# Patient Record
Sex: Male | Born: 1970 | Race: White | Hispanic: No | Marital: Married | Smoking: Never smoker
Health system: Southern US, Community
[De-identification: ages and names within clinical notes are randomized; demographics above are authoritative.]

## PROBLEM LIST (undated history)

## (undated) DIAGNOSIS — I10 Essential (primary) hypertension: Secondary | ICD-10-CM

## (undated) DIAGNOSIS — K5790 Diverticulosis of intestine, part unspecified, without perforation or abscess without bleeding: Secondary | ICD-10-CM

## (undated) HISTORY — DX: Diverticulosis of intestine, part unspecified, without perforation or abscess without bleeding: K57.90

## (undated) HISTORY — DX: Essential (primary) hypertension: I10

---

## 2016-03-14 HISTORY — PX: COLONOSCOPY: SHX174

## 2016-03-14 LAB — HM COLONOSCOPY

## 2019-01-13 ENCOUNTER — Encounter: Payer: Self-pay | Admitting: Family Medicine

## 2019-01-13 ENCOUNTER — Ambulatory Visit: Payer: Managed Care, Other (non HMO) | Admitting: Family Medicine

## 2019-01-13 VITALS — BP 143/97 | HR 88 | Temp 98.6°F | Resp 16 | Ht 72.75 in | Wt 197.0 lb

## 2019-01-13 DIAGNOSIS — I10 Essential (primary) hypertension: Secondary | ICD-10-CM

## 2019-01-13 NOTE — Progress Notes (Signed)
Office Note 01/13/2019  CC:  Chief Complaint  Patient presents with  . Establish Care    hx of HTN    HPI:  Geoffrey Thomas is a 48 y.o. male who is here to establish care Patient's most recent primary MD: [facility] ([facility]). Old records were not available for review prior to or during today's visit.  Most recent CPE with his prior PCP was April 2019.  He also gets full CPE with labs via [employer].   HTN: bp usually 125 over <80.  Historically well controlled. He eats a low Na diet, exercises regularly.  Has been on losartan 100mg  qd for many years. All health panel labs were normal at that time.  Past Medical History:  Diagnosis Date  . Diverticulosis    +hx of 'itis.  . Hypertension     Past Surgical History:  Procedure Laterality Date  . COLONOSCOPY  2016   +polyps; per pt 10 yr f/u.  (?[facility])--->pt not sure, need records.    Family History  Problem Relation Age of Onset  . Stroke Mother   . Hypertension Father   . Prostatitis Father   . Hypertension Paternal Grandfather     Social History   Socioeconomic History  . Marital status: Married    Spouse name: Not on file  . Number of children: 2  . Years of education: Not on file  . Highest education level: Not on file  Occupational History  . Not on file  Social Needs  . Financial resource strain: Not on file  . Food insecurity:    Worry: Not on file    Inability: Not on file  . Transportation needs:    Medical: Not on file    Non-medical: Not on file  Tobacco Use  . Smoking status: Never Smoker  . Smokeless tobacco: Never Used  Substance and Sexual Activity  . Alcohol use: Not Currently  . Drug use: Not Currently  . Sexual activity: Yes    Partners: Female  Lifestyle  . Physical activity:    Days per week: 5 days    Minutes per session: 50 min  . Stress: Not on file  Relationships  . Social connections:    Talks on phone: Not on file    Gets together: Not  on file    Attends religious service: Not on file    Active member of club or organization: Not on file    Attends meetings of clubs or organizations: Not on file    Relationship status: Not on file  . Intimate partner violence:    Fear of current or ex partner: Not on file    Emotionally abused: Not on file    Physically abused: Not on file    Forced sexual activity: Not on file  Other Topics Concern  . Not on file  Social History Narrative   Married, 2 children ([name] and Piqua).   Orig from [place].   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   No T/A/Ds.       Outpatient Encounter Medications as of 01/13/2019  Medication Sig  . losartan (COZAAR) 100 MG tablet TAKE 1 TABLET BY MOUTH DAILY   No facility-administered encounter medications on file as of 01/13/2019.     No Known Allergies  ROS Review of Systems  Constitutional: Negative for fatigue and fever.  HENT: Negative for congestion and sore throat.   Eyes: Negative for visual  disturbance.  Respiratory: Negative for cough and shortness of breath.   Cardiovascular: Negative for chest pain and leg swelling.  Gastrointestinal: Negative for abdominal pain and nausea.  Genitourinary: Negative for dysuria.  Musculoskeletal: Negative for back pain and joint swelling.  Skin: Negative for rash.  Neurological: Negative for weakness and headaches.  Hematological: Negative for adenopathy.    PE; Blood pressure (!) 143/97, pulse 88, temperature 98.6 F (37 C), temperature source Oral, resp. rate 16, height 6' 0.75" (1.848 m), weight 197 lb (89.4 kg), SpO2 100 %. Body mass index is 26.17 kg/m.  Gen: Alert, well appearing.  Patient is oriented to person, place, time, and situation. AFFECT: pleasant, lucid thought and speech. CV: RRR, no m/r/g.   LUNGS: CTA bilat, nonlabored resps, good aeration in all lung fields. ABD: soft, ND/NT EXT: no clubbing or cyanosis.  no edema.  Skin: no  rashes, no jaundice or pallor.  Pertinent labs:  none  ASSESSMENT AND PLAN:   New pt: will obtain GI records of his colonoscopy b/c he is unsure of exact findings and recommendations of recall.  1) Essential HTN: bp up here today.  Consistently normal when monitoring at home on losartan 100 mg long term.  Pt states it is common for it to be elevated initially in MD office but on subsequent checks it comes down.  He has been very busy the last 4d due to father being in hospital. No change in med today. Last labs 02/2018 (CBC, FLP, TSH, CMET) all normal.  He will be getting his physical with labs through the PD in 3 months or so.  An After Visit Summary was printed and given to the patient.  Return in about 6 months (around 07/14/2019) for annual CPE (fasting).  Signed:  Santiago Bumpers, MD           01/13/2019

## 2019-01-14 ENCOUNTER — Encounter: Payer: Self-pay | Admitting: Family Medicine

## 2019-01-18 ENCOUNTER — Encounter: Payer: Self-pay | Admitting: Family Medicine

## 2019-02-28 ENCOUNTER — Other Ambulatory Visit: Payer: Self-pay

## 2019-02-28 MED ORDER — LOSARTAN POTASSIUM 100 MG PO TABS: 100.0000 mg | ORAL_TABLET | Freq: Every day | ORAL | 1 refills | Status: DC

## 2019-07-27 ENCOUNTER — Encounter: Payer: Managed Care, Other (non HMO) | Admitting: Family Medicine

## 2019-08-29 ENCOUNTER — Encounter: Payer: Managed Care, Other (non HMO) | Admitting: Family Medicine

## 2019-09-22 ENCOUNTER — Ambulatory Visit: Payer: Managed Care, Other (non HMO) | Admitting: Family Medicine

## 2019-09-22 ENCOUNTER — Other Ambulatory Visit: Payer: Self-pay

## 2019-09-22 ENCOUNTER — Encounter: Payer: Self-pay | Admitting: Family Medicine

## 2019-09-22 VITALS — BP 165/85 | HR 97 | Temp 99.1°F | Resp 16 | Ht 72.75 in | Wt 197.5 lb

## 2019-09-22 DIAGNOSIS — Z23 Encounter for immunization: Secondary | ICD-10-CM | POA: Diagnosis not present

## 2019-09-22 DIAGNOSIS — I10 Essential (primary) hypertension: Secondary | ICD-10-CM | POA: Diagnosis not present

## 2019-09-22 DIAGNOSIS — Z0000 Encounter for general adult medical examination without abnormal findings: Secondary | ICD-10-CM | POA: Diagnosis not present

## 2019-09-22 MED ORDER — LOSARTAN POTASSIUM 100 MG PO TABS: 100.0000 mg | ORAL_TABLET | Freq: Every day | ORAL | 1 refills | Status: DC

## 2019-09-22 NOTE — Progress Notes (Signed)
Office Note 09/22/2019  CC:  Chief Complaint  Patient presents with  . Annual Exam    pt is not fasting    HPI:  Geoffrey Thomas is a 48 y.o. White male who is here for annual health maintenance exam. 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  This was not done this year due to covid pandemic. He has had vegetable soup today about 1.5 hours ago.    Has HTN with hx of white coat component. Home bp's typically high 130s over high 80s.    Not in good routine of exercise since March this year. Not eating very healthy last few months, either.   Past Medical History:  Diagnosis Date  . Diverticulosis    +hx of 'itis.  . Hypertension     Past Surgical History:  Procedure Laterality Date  . COLONOSCOPY  03/14/2016   Report in chart, repeat in 10 years ([facility] [place])    Family History  Problem Relation Age of Onset  . Stroke Mother   . Hypertension Father   . Prostatitis Father   . Hypertension Paternal Grandfather     Social History   Socioeconomic History  . Marital status: Married    Spouse name: Not on file  . Number of children: 2  . Years of education: Not on file  . Highest education level: Not on file  Occupational History  . Not on file  Social Needs  . Financial resource strain: Not on file  . Food insecurity    Worry: Not on file    Inability: Not on file  . Transportation needs    Medical: Not on file    Non-medical: Not on file  Tobacco Use  . Smoking status: Never Smoker  . Smokeless tobacco: Never Used  Substance and Sexual Activity  . Alcohol use: Not Currently  . Drug use: Not Currently  . Sexual activity: Yes    Partners: Female  Lifestyle  . Physical activity    Days per week: 5 days    Minutes per session: 50 min  . Stress: Not on file  Relationships  . Social Musician on phone: Not on file    Gets together: Not on file    Attends religious service: Not on file    Active member of club  or organization: Not on file    Attends meetings of clubs or organizations: Not on file    Relationship status: Not on file  . Intimate partner violence    Fear of current or ex partner: Not on file    Emotionally abused: Not on file    Physically abused: Not on file    Forced sexual activity: Not on file  Other Topics Concern  . Not on file  Social History Narrative   Married, 2 children (Geoffrey Thomas and Geoffrey Thomas).   Orig from [place].   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   No T/A/Ds.       Outpatient Medications Prior to Visit  Medication Sig Dispense Refill  . losartan (COZAAR) 100 MG tablet Take 1 tablet (100 mg total) by mouth daily. 90 tablet 1   No facility-administered medications prior to visit.     No Known Allergies  ROS Review of Systems  Constitutional: Negative for appetite change, chills, fatigue and fever.  HENT: Negative for congestion, dental problem, ear pain and sore throat.   Eyes: Negative for  discharge, redness and visual disturbance.  Respiratory: Negative for cough, chest tightness, shortness of breath and wheezing.   Cardiovascular: Negative for chest pain, palpitations and leg swelling.  Gastrointestinal: Negative for abdominal pain, blood in stool, diarrhea, nausea and vomiting.  Genitourinary: Negative for difficulty urinating, dysuria, flank pain, frequency, hematuria and urgency.  Musculoskeletal: Negative for arthralgias, back pain, joint swelling, myalgias and neck stiffness.  Skin: Negative for pallor and rash.  Neurological: Negative for dizziness, speech difficulty, weakness and headaches.  Hematological: Negative for adenopathy. Does not bruise/bleed easily.  Psychiatric/Behavioral: Negative for confusion and sleep disturbance. The patient is not nervous/anxious.    PE; BP initially 165/85 automated cuff today; repeat with manual cuff later in the visit was 140/80 Blood pressure (!) 165/85, pulse  97, temperature 99.1 F (37.3 C), temperature source Temporal, resp. rate 16, height 6' 0.75" (1.848 m), weight 197 lb 8 oz (89.6 kg), SpO2 97 %. Body mass index is 26.24 kg/m.  Gen: Alert, well appearing.  Patient is oriented to person, place, time, and situation. AFFECT: pleasant, lucid thought and speech. ENT: Ears: EACs clear, normal epithelium.  TMs with good light reflex and landmarks bilaterally.  Eyes: no injection, icteris, swelling, or exudate.  EOMI, PERRLA. Nose: no drainage or turbinate edema/swelling.  No injection or focal lesion.  Mouth: lips without lesion/swelling.  Oral mucosa pink and moist.  Dentition intact and without obvious caries or gingival swelling.  Oropharynx without erythema, exudate, or swelling.  Neck: supple/nontender.  No LAD, mass, or TM.  Carotid pulses 2+ bilaterally, without bruits. CV: RRR, no m/r/g.   LUNGS: CTA bilat, nonlabored resps, good aeration in all lung fields. ABD: soft, NT, ND, BS normal.  No hepatospenomegaly or mass.  No bruits. EXT: no clubbing, cyanosis, or edema.  Musculoskeletal: no joint swelling, erythema, warmth, or tenderness.  ROM of all joints intact. Skin - no sores or suspicious lesions or rashes or color changes   Pertinent labs:  None  ASSESSMENT AND PLAN:   1) HTN: not ideal control.  I recommended adding a second bp med today but he really doesn't like the idea of taking an additional med for HTN.  He said he will think about it and increase home bp monitoring.  Also wants to get back into better dietary and exercise habits. Instructions: Monitor your blood pressure at least 2 times per week. Normal blood pressure is 130/80 or lower. If greater than 140/90 AVERAGE then I strongly recommend you call or return so I can add an additional bp med.  2) Health maintenance exam: Reviewed age and gender appropriate health maintenance issues (prudent diet, regular exercise, health risks of tobacco and excessive alcohol, use of  seatbelts, fire alarms in home, use of sunscreen).  Also reviewed age and gender appropriate health screening as well as vaccine recommendations. Vaccines: flu vaccine->given today. Labs: health panel today: he prefered this be drawn today despite not being fasting. Prostate ca screening: average risk patient= as per latest guidelines, start screening at 62 yrs of age. Colon ca screening: next colonoscopy due 2027.  An After Visit Summary was printed and given to the patient.  FOLLOW UP:  Return in about 6 months (around 03/22/2020) for routine chronic illness f/u.  Signed:  Crissie Sickles, MD           09/22/2019

## 2019-09-22 NOTE — Addendum Note (Signed)
Addended by: Deveron Furlong D on: 09/22/2019 02:29 PM   Modules accepted: Orders

## 2019-09-22 NOTE — Patient Instructions (Signed)
Monitor your blood pressure at least 2 times per week. Normal blood pressure is 130/80 or lower. If greater than 140/90 AVERAGE then I strongly recommend you call or return so I can add an additional bp med.   Health Maintenance, Male Adopting a healthy lifestyle and getting preventive care are important in promoting health and wellness. Ask your health care provider about:  The right schedule for you to have regular tests and exams.  Things you can do on your own to prevent diseases and keep yourself healthy. What should I know about diet, weight, and exercise? Eat a healthy diet   Eat a diet that includes plenty of vegetables, fruits, low-fat dairy products, and lean protein.  Do not eat a lot of foods that are high in solid fats, added sugars, or sodium. Maintain a healthy weight Body mass index (BMI) is a measurement that can be used to identify possible weight problems. It estimates body fat based on height and weight. Your health care provider can help determine your BMI and help you achieve or maintain a healthy weight. Get regular exercise Get regular exercise. This is one of the most important things you can do for your health. Most adults should:  Exercise for at least 150 minutes each week. The exercise should increase your heart rate and make you sweat (moderate-intensity exercise).  Do strengthening exercises at least twice a week. This is in addition to the moderate-intensity exercise.  Spend less time sitting. Even light physical activity can be beneficial. Watch cholesterol and blood lipids Have your blood tested for lipids and cholesterol at 48 years of age, then have this test every 5 years. You may need to have your cholesterol levels checked more often if:  Your lipid or cholesterol levels are high.  You are older than 48 years of age.  You are at high risk for heart disease. What should I know about cancer screening? Many types of cancers can be detected  early and may often be prevented. Depending on your health history and family history, you may need to have cancer screening at various ages. This may include screening for:  Colorectal cancer.  Prostate cancer.  Skin cancer.  Lung cancer. What should I know about heart disease, diabetes, and high blood pressure? Blood pressure and heart disease  High blood pressure causes heart disease and increases the risk of stroke. This is more likely to develop in people who have high blood pressure readings, are of African descent, or are overweight.  Talk with your health care provider about your target blood pressure readings.  Have your blood pressure checked: ? Every 3-5 years if you are 27-51 years of age. ? Every year if you are 38 years old or older.  If you are between the ages of 62 and 5 and are a current or former smoker, ask your health care provider if you should have a one-time screening for abdominal aortic aneurysm (AAA). Diabetes Have regular diabetes screenings. This checks your fasting blood sugar level. Have the screening done:  Once every three years after age 55 if you are at a normal weight and have a low risk for diabetes.  More often and at a younger age if you are overweight or have a high risk for diabetes. What should I know about preventing infection? Hepatitis B If you have a higher risk for hepatitis B, you should be screened for this virus. Talk with your health care provider to find out if you are  at risk for hepatitis B infection. Hepatitis C Blood testing is recommended for:  Everyone born from [birth year] through 1965.  Anyone with known risk factors for hepatitis C. Sexually transmitted infections (STIs)  You should be screened each year for STIs, including gonorrhea and chlamydia, if: ? You are sexually active and are younger than 48 years of age. ? You are older than 48 years of age and your health care provider tells you that you are at risk for this  type of infection. ? Your sexual activity has changed since you were last screened, and you are at increased risk for chlamydia or gonorrhea. Ask your health care provider if you are at risk.  Ask your health care provider about whether you are at high risk for HIV. Your health care provider may recommend a prescription medicine to help prevent HIV infection. If you choose to take medicine to prevent HIV, you should first get tested for HIV. You should then be tested every 3 months for as long as you are taking the medicine. Follow these instructions at home: Lifestyle  Do not use any products that contain nicotine or tobacco, such as cigarettes, e-cigarettes, and chewing tobacco. If you need help quitting, ask your health care provider.  Do not use street drugs.  Do not share needles.  Ask your health care provider for help if you need support or information about quitting drugs. Alcohol use  Do not drink alcohol if your health care provider tells you not to drink.  If you drink alcohol: ? Limit how much you have to 0-2 drinks a day. ? Be aware of how much alcohol is in your drink. In the [place] one drink equals one 12 oz bottle of beer (355 mL), one 5 oz glass of wine (148 mL), or one 1 oz glass of hard liquor (44 mL). General instructions  Schedule regular health, dental, and eye exams.  Stay current with your vaccines.  Tell your health care provider if: ? You often feel depressed. ? You have ever been abused or do not feel safe at home. Summary  Adopting a healthy lifestyle and getting preventive care are important in promoting health and wellness.  Follow your health care provider's instructions about healthy diet, exercising, and getting tested or screened for diseases.  Follow your health care provider's instructions on monitoring your cholesterol and blood pressure. This information is not intended to replace advice given to you by your health care provider. Make sure  you discuss any questions you have with your health care provider. Document Released: 05/22/2008 Document Revised: 11/17/2018 Document Reviewed: 11/17/2018 Elsevier Patient Education  2020 [employer].

## 2019-09-23 ENCOUNTER — Ambulatory Visit: Payer: Managed Care, Other (non HMO)

## 2019-09-23 ENCOUNTER — Other Ambulatory Visit: Payer: Self-pay | Admitting: Family Medicine

## 2019-09-23 DIAGNOSIS — R7989 Other specified abnormal findings of blood chemistry: Secondary | ICD-10-CM

## 2019-09-23 LAB — TSH: TSH: 0.74 u[IU]/mL (ref 0.35–4.50)

## 2019-09-23 LAB — CBC WITH DIFFERENTIAL/PLATELET
Basophils Absolute: 0.1 10*3/uL (ref 0.0–0.1)
Basophils Relative: 0.9 % (ref 0.0–3.0)
Eosinophils Absolute: 0.1 10*3/uL (ref 0.0–0.7)
Eosinophils Relative: 1.1 % (ref 0.0–5.0)
HCT: 47.5 % (ref 39.0–52.0)
Hemoglobin: 15.8 g/dL (ref 13.0–17.0)
Lymphocytes Relative: 18.6 % (ref 12.0–46.0)
Lymphs Abs: 1.7 10*3/uL (ref 0.7–4.0)
MCHC: 33.3 g/dL (ref 30.0–36.0)
MCV: 91.4 fl (ref 78.0–100.0)
Monocytes Absolute: 0.9 10*3/uL (ref 0.1–1.0)
Monocytes Relative: 9.7 % (ref 3.0–12.0)
Neutro Abs: 6.2 10*3/uL (ref 1.4–7.7)
Neutrophils Relative %: 69.7 % (ref 43.0–77.0)
Platelets: 286 10*3/uL (ref 150.0–400.0)
RBC: 5.2 Mil/uL (ref 4.22–5.81)
RDW: 13.2 % (ref 11.5–15.5)
WBC: 8.9 10*3/uL (ref 4.0–10.5)

## 2019-09-23 LAB — LIPID PANEL
Cholesterol: 162 mg/dL (ref 0–200)
HDL: 40.4 mg/dL (ref >39.00)
LDL Cholesterol: 107 mg/dL — ABNORMAL HIGH (ref 0–99)
NonHDL: 121.1
Total CHOL/HDL Ratio: 4
Triglycerides: 72 mg/dL (ref 0.0–149.0)
VLDL: 14.4 mg/dL (ref 0.0–40.0)

## 2019-09-23 LAB — COMPREHENSIVE METABOLIC PANEL
ALT: 13 U/L (ref 0–53)
AST: 21 U/L (ref 0–37)
Albumin: 4.7 g/dL (ref 3.5–5.2)
Alkaline Phosphatase: 66 U/L (ref 39–117)
BUN: 25 mg/dL — ABNORMAL HIGH (ref 6–23)
CO2: 29 mEq/L (ref 19–32)
Calcium: 9.8 mg/dL (ref 8.4–10.5)
Chloride: 102 mEq/L (ref 96–112)
Creatinine, Ser: 1.62 mg/dL — ABNORMAL HIGH (ref 0.40–1.50)
GFR: 45.74 mL/min — ABNORMAL LOW (ref >60.00)
Glucose, Bld: 100 mg/dL — ABNORMAL HIGH (ref 70–99)
Potassium: 4.8 mEq/L (ref 3.5–5.1)
Sodium: 140 mEq/L (ref 135–145)
Total Bilirubin: 1.2 mg/dL (ref 0.2–1.2)
Total Protein: 6.5 g/dL (ref 6.0–8.3)

## 2019-09-26 ENCOUNTER — Telehealth: Payer: Self-pay | Admitting: Family Medicine

## 2019-09-26 NOTE — Telephone Encounter (Signed)
Ok to hold off on the ultrasound for now. Keep plan of repeating lab and if not improved then will need to do ultrasound. Stay off NSAIDs-thx

## 2019-09-26 NOTE — Telephone Encounter (Signed)
Patient advised and voiced understanding.  

## 2019-09-26 NOTE — Telephone Encounter (Signed)
Patient called in. Patient had been experiencing shoulder pain 3-5 days prior to appointment. Patient was taking OTC Ibuprofen and Aleve. Please call patient back. He wants to know if he still needs to have the [place].

## 2019-09-26 NOTE — Telephone Encounter (Signed)
Patient's last visit was 09/22/19 for CPE. No mention of shoulder pain. He has taken OTC Ibuprofen and aleve. US renal ordered at the time of appt 10/15.   Please advise if [place] should still be done?

## 2019-10-05 NOTE — Telephone Encounter (Signed)
Should 10/14/19 visit be an office visit or a lab visit only?

## 2019-10-05 NOTE — Telephone Encounter (Signed)
The 11/6 appointment will be an office visit with [place].

## 2019-10-14 ENCOUNTER — Ambulatory Visit: Payer: Managed Care, Other (non HMO) | Admitting: Family Medicine

## 2020-01-17 ENCOUNTER — Telehealth: Payer: Self-pay

## 2020-01-17 ENCOUNTER — Other Ambulatory Visit: Payer: Self-pay

## 2020-01-17 MED ORDER — LOSARTAN POTASSIUM 100 MG PO TABS: 100.0000 mg | ORAL_TABLET | Freq: Every day | ORAL | 0 refills | Status: AC

## 2020-01-17 NOTE — Telephone Encounter (Signed)
Patient changed insurance companies effective 12/09/19. He has to change pharmacies also.  He was enrolled in mail order pharmacy with [place].  Preferred pharmacy is now [facility]. Please update.  He has 1 refill left on blood pressure meds but [facility] will not call [employer] [facility] to get the med transferred to them.    They are requesting a new prescription;  losartan (COZAAR) 100 MG tablet [[id number]   Thank you

## 2020-01-17 NOTE — Telephone Encounter (Signed)
Contacted [employer] to verify no other refills to transfer. RF sent to [facility], [address]

## 2020-03-23 ENCOUNTER — Ambulatory Visit: Payer: Managed Care, Other (non HMO) | Admitting: Family Medicine

## 2020-07-21 ENCOUNTER — Other Ambulatory Visit: Payer: Self-pay

## 2020-07-21 ENCOUNTER — Emergency Department (INDEPENDENT_AMBULATORY_CARE_PROVIDER_SITE_OTHER): Payer: BLUE CROSS/BLUE SHIELD

## 2020-07-21 ENCOUNTER — Emergency Department (INDEPENDENT_AMBULATORY_CARE_PROVIDER_SITE_OTHER)
Admission: EM | Admit: 2020-07-21 | Discharge: 2020-07-21 | Disposition: A | Discharge status: Home / Self Care | Payer: BLUE CROSS/BLUE SHIELD | Source: Home / Self Care | Attending: Family Medicine | Admitting: Family Medicine

## 2020-07-21 ENCOUNTER — Encounter: Payer: Self-pay | Admitting: Emergency Medicine

## 2020-07-21 DIAGNOSIS — W240XXA Contact with lifting devices, not elsewhere classified, initial encounter: Secondary | ICD-10-CM | POA: Diagnosis not present

## 2020-07-21 DIAGNOSIS — S90411A Abrasion, right great toe, initial encounter: Secondary | ICD-10-CM | POA: Diagnosis not present

## 2020-07-21 DIAGNOSIS — S90414A Abrasion, right lesser toe(s), initial encounter: Secondary | ICD-10-CM

## 2020-07-21 DIAGNOSIS — S92424A Nondisplaced fracture of distal phalanx of right great toe, initial encounter for closed fracture: Secondary | ICD-10-CM

## 2020-07-21 MED ORDER — DOXYCYCLINE HYCLATE 100 MG PO CAPS: ORAL_CAPSULE | ORAL | 0 refills | Status: AC

## 2020-07-21 NOTE — ED Triage Notes (Signed)
Patient brought from car to triage room in wheelchair.

## 2020-07-21 NOTE — ED Triage Notes (Signed)
Patient just had medal ramp from trailer fall on his right foot across toes; has lacerations and bruising across tops of toes 1,2,and 3. States tdap about 4-5 years ago.  Has had not had covid vaccination.

## 2020-07-21 NOTE — ED Provider Notes (Signed)
[facility]    CSN: [id number] Arrival date & time: 07/21/20  1408      History   Chief Complaint Chief Complaint  Patient presents with  . Foot Injury    HPI Geoffrey Thomas is a 49 y.o. male.   About two hours ago while loading a skid-steer loader onto his trailer, he dropped the end of metal ramp onto the top of his right distal foot.  He has pain in his first and second toes.   The history is provided by the patient.  Foot Injury Location:  Foot Time since incident:  2 hours Injury: yes   Mechanism of injury: crush   Crush:    Mechanism:  Falling object Foot location:  R foot Pain details:    Quality:  Aching   Radiates to:  Does not radiate   Severity:  Moderate   Onset quality:  Sudden   Duration:  2 hours   Timing:  Constant   Progression:  Unchanged Chronicity:  New Foreign body present:  No foreign bodies Tetanus status:  Up to date Prior injury to area:  No Relieved by:  None tried Worsened by:  Bearing weight Ineffective treatments:  None tried Associated symptoms: decreased ROM   Associated symptoms: no numbness     Past Medical History:  Diagnosis Date  . Diverticulosis    +hx of 'itis.  . Hypertension     There are no problems to display for this patient.   Past Surgical History:  Procedure Laterality Date  . COLONOSCOPY  03/14/2016   Report in chart, repeat in 10 years ([facility] in [place])       Home Medications    Prior to Admission medications   Medication Sig Start Date End Date Taking? Authorizing Provider  doxycycline (VIBRAMYCIN) 100 MG capsule Take one cap PO Q12hr with food. 07/21/20   Lattie Haw, MD  losartan (COZAAR) 100 MG tablet Take 1 tablet (100 mg total) by mouth daily. 01/17/20   McGowen, Maryjean Morn, MD    Family History Family History  Problem Relation Age of Onset  . Stroke Mother   . Hypertension Father   . Prostatitis Father   . Hypertension Paternal Grandfather     Social  History Social History   Tobacco Use  . Smoking status: Never Smoker  . Smokeless tobacco: Never Used  Substance Use Topics  . Alcohol use: Not Currently  . Drug use: Not Currently     Allergies   Patient has no known allergies.   Review of Systems Review of Systems  Musculoskeletal: Positive for joint swelling.  Skin: Positive for wound.  All other systems reviewed and are negative.    Physical Exam Triage Vital Signs ED Triage Vitals  Enc Vitals Group     BP 07/21/20 1523 127/84     Pulse Rate 07/21/20 1523 86     Resp 07/21/20 1523 16     Temp 07/21/20 1523 98 F (36.7 C)     Temp Source 07/21/20 1523 Oral     SpO2 07/21/20 1523 99 %     Weight 07/21/20 1524 187 lb (84.8 kg)     Height 07/21/20 1524 6\' 2"  (1.88 m)     Head Circumference --      Peak Flow --      Pain Score 07/21/20 1524 6     Pain Loc --      Pain Edu? --      Excl.  in GC? --    No data found.  Updated Vital Signs BP 127/84 (BP Location: Right Arm)   Pulse 86   Temp 98 F (36.7 C) (Oral)   Resp 16   Ht 6\' 2"  (1.88 m)   Wt 84.8 kg   SpO2 99%   BMI 24.01 kg/m   Visual Acuity Right Eye Distance:   Left Eye Distance:   Bilateral Distance:    Right Eye Near:   Left Eye Near:    Bilateral Near:     Physical Exam Vitals and nursing note reviewed.  Constitutional:      General: He is not in acute distress. HENT:     Head: Atraumatic.  Eyes:     Pupils: Pupils are equal, round, and reactive to light.  Cardiovascular:     Rate and Rhythm: Normal rate.  Musculoskeletal:       Feet:     Comments: Right first and second toes have skin tears just proximal to the base of toenails.  Toenails are intact.  Great toe has tenderness to palpation over the distal phalanx with swelling present and decreased range of motion.  Cap refill is normal.  Skin:    General: Skin is warm and dry.  Neurological:     Mental Status: He is alert and oriented to person, place, and time.      UC  Treatments / Results  Labs (all labs ordered are listed, but only abnormal results are displayed) Labs Reviewed - No data to display  EKG   Radiology DG Foot Complete Right  Result Date: 07/21/2020 CLINICAL DATA:  Dropped metal Rams on toes EXAM: RIGHT FOOT COMPLETE - 3+ VIEW COMPARISON:  None. FINDINGS: There is a highly comminuted nondisplaced fracture seen through the distal phalanx and tuft of the first digit. No intra-articular extension. Overlying soft tissue swelling is seen. The nail bed appears to be intact. 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 Calcaneal enthesophytes are noted. IMPRESSION: Comminuted nondisplaced first distal phalanx fracture. No intra-articular extension. Electronically Signed   By: 07/23/2020 M.D.   On: 07/21/2020 15:51    Procedures Procedures  Skin tears of right first and second toes gently repositioned in normal anatomic position with sterile technique.  No anesthesia necessary.  Medications Ordered in UC Medications - No data to display  Initial Impression / Assessment and Plan / UC Course  I have reviewed the triage vital signs and the nursing notes.  Pertinent labs & imaging results that were available during my care of the patient were reviewed by me and considered in my medical decision making (see chart for details).    Abrasions right first and second toes dressed with bacitracin ointment, Mepelex light, Kerlix gauze, and secured with CoBan wrap. Begin empiric doxycycline. Return tomorrow for dressing change. Followup with Dr[facility] 07/23/2020 ([facility] in about 3 weeks for toe fracture   Final Clinical Impressions(s) / UC Diagnoses   Final diagnoses:  Abrasion of right great toe, initial encounter  Abrasion of second toe of right foot, initial encounter  Closed nondisplaced fracture of distal phalanx of right great toe, initial encounter     Discharge Instructions     Elevate foot.   May take Tylenol as needed for pain.  Keep bandages clean and dry. Recommend taking calcium and vitamin D supplement daily.   ED Prescriptions    Medication Sig Dispense Auth. Provider   doxycycline (VIBRAMYCIN) 100 MG capsule Take one cap PO  Q12hr with food. 29 capsule Lattie Haw, MD        Lattie Haw, MD 07/25/20 [phone]

## 2020-07-21 NOTE — Discharge Instructions (Addendum)
Elevate foot.  May take Tylenol as needed for pain.  Keep bandages clean and dry. Recommend taking calcium and vitamin D supplement daily.

## 2020-12-15 IMAGING — DX DG FOOT COMPLETE 3+V*R*
3 series · 3 of 3 positions shown · non-contrast
Comparison: None.

CLINICAL DATA: Dropped metal Rams on toes

EXAM:
RIGHT FOOT COMPLETE - 3+ VIEW

[foot ap]
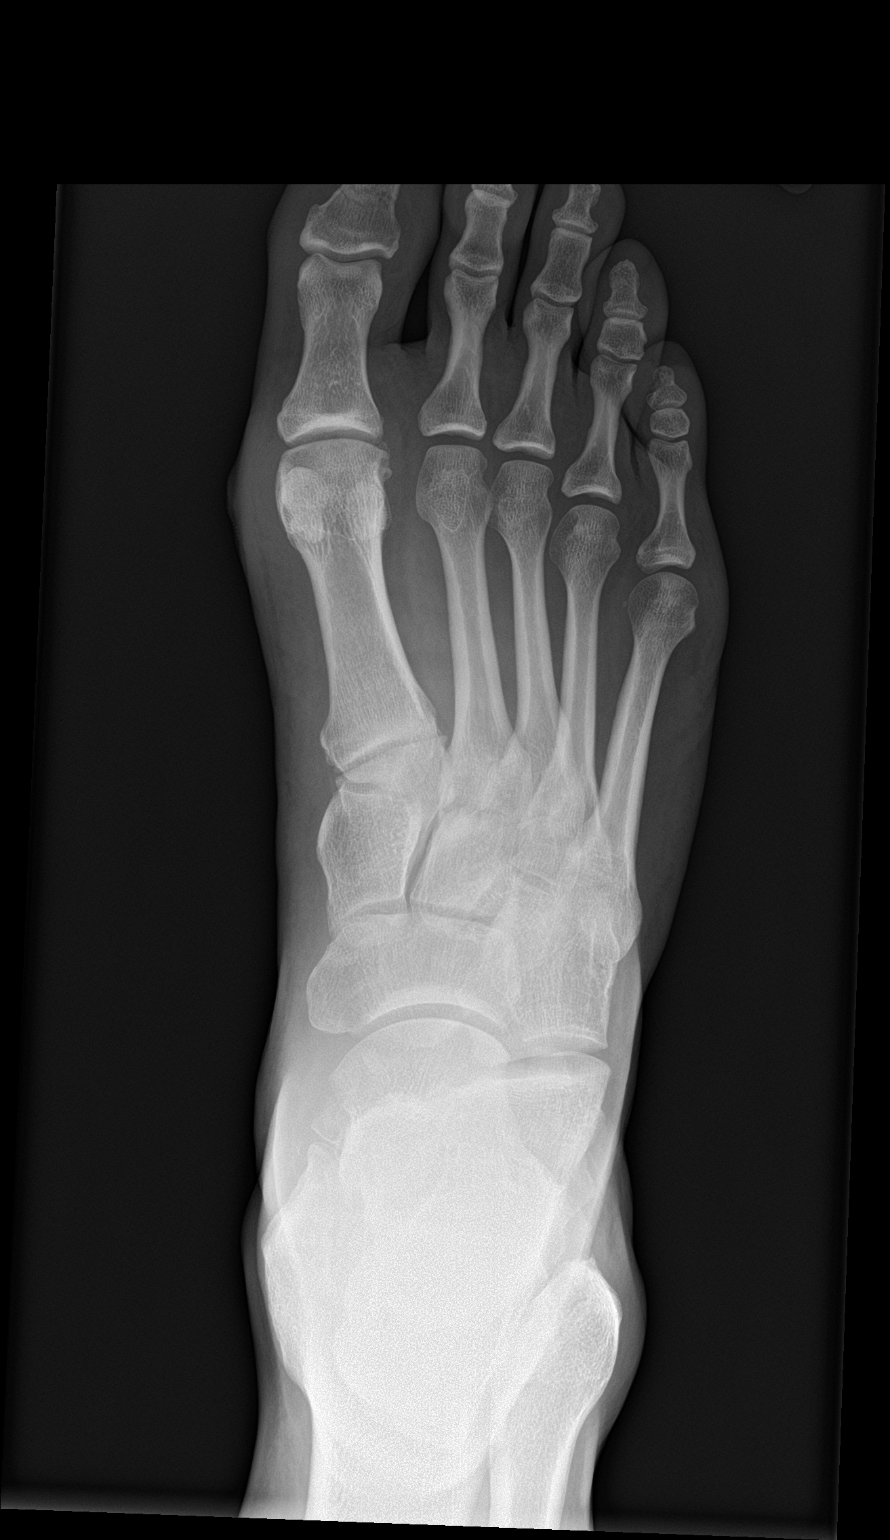

[foot obl]
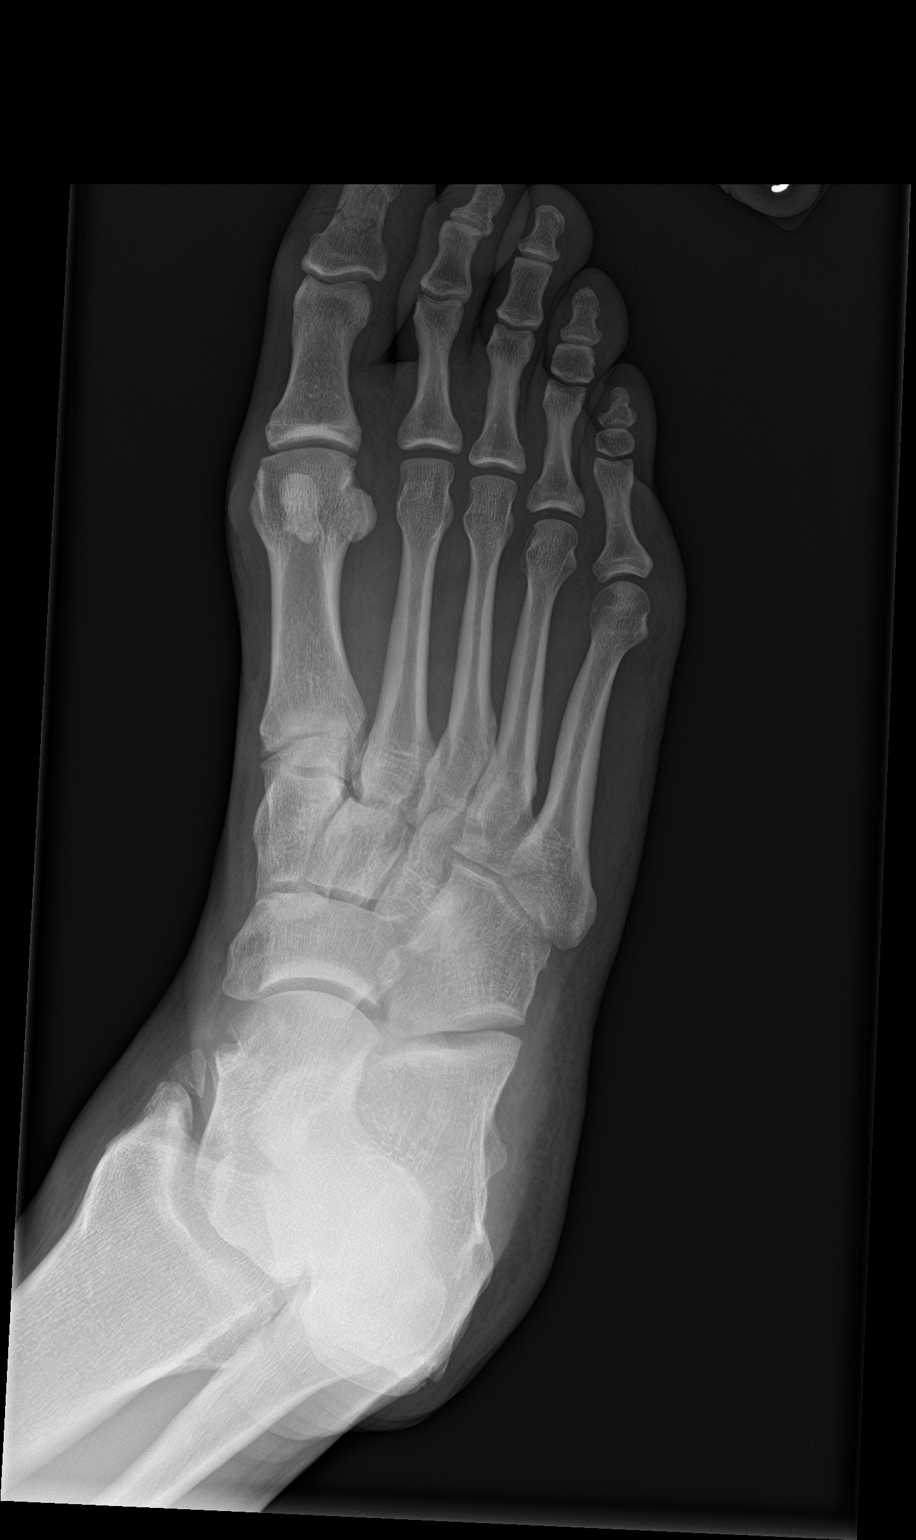

[foot lat]
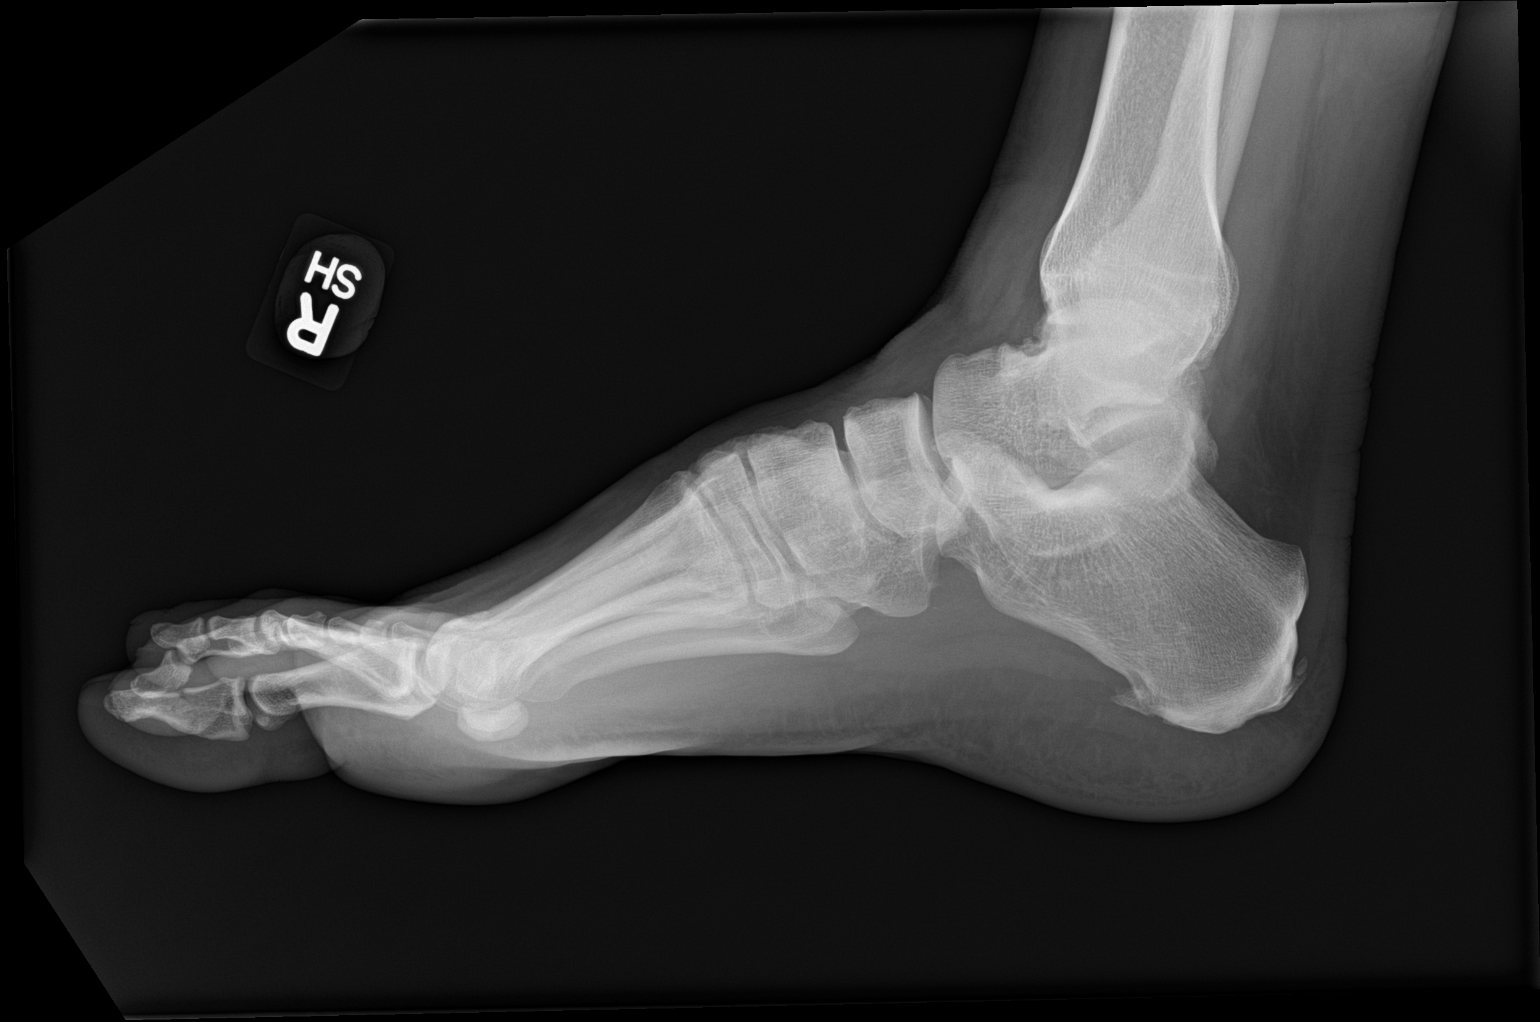

[3 of 3 positions shown; findings below may reference images not displayed]

FINDINGS: There is a highly comminuted nondisplaced fracture seen through the
distal phalanx and tuft of the first digit. No intra-articular
extension. Overlying soft tissue swelling is seen. The nail bed
appears to be intact. Mild midfoot osteoarthritis seen with dorsal
osteophyte formation. Calcaneal enthesophytes are noted.
IMPRESSION: Comminuted nondisplaced first distal phalanx fracture. No
intra-articular extension.
